# Patient Record
Sex: Male | Born: 1985 | Race: Black or African American | Hispanic: No | Marital: Single | Smoking: Never smoker
Health system: Southern US, Community
[De-identification: ages and names within clinical notes are randomized; demographics above are authoritative.]

## PROBLEM LIST (undated history)

## (undated) HISTORY — PX: NO PAST SURGERIES: SHX2092

---

## 2011-07-22 ENCOUNTER — Emergency Department: Payer: Self-pay | Admitting: *Deleted

## 2013-07-05 ENCOUNTER — Ambulatory Visit: Payer: Self-pay

## 2016-04-06 ENCOUNTER — Ambulatory Visit
Admission: EM | Admit: 2016-04-06 | Discharge: 2016-04-06 | Disposition: A | Discharge status: Home / Self Care | Payer: Self-pay | Attending: Emergency Medicine | Admitting: Emergency Medicine

## 2016-04-06 DIAGNOSIS — R059 Cough, unspecified: Secondary | ICD-10-CM

## 2016-04-06 DIAGNOSIS — K649 Unspecified hemorrhoids: Secondary | ICD-10-CM

## 2016-04-06 DIAGNOSIS — R05 Cough: Secondary | ICD-10-CM

## 2016-04-06 DIAGNOSIS — K219 Gastro-esophageal reflux disease without esophagitis: Secondary | ICD-10-CM

## 2016-04-06 MED ORDER — IBUPROFEN 600 MG PO TABS: 600.0000 mg | ORAL_TABLET | Freq: Four times a day (QID) | ORAL | 0 refills | Status: DC | PRN

## 2016-04-06 MED ORDER — HYDROCORTISONE ACE-PRAMOXINE 2.5-1 % RE CREA: 1.0000 "application " | TOPICAL_CREAM | Freq: Three times a day (TID) | RECTAL | 0 refills | Status: DC

## 2016-04-06 MED ORDER — PANTOPRAZOLE SODIUM 40 MG PO TBEC: 40.0000 mg | DELAYED_RELEASE_TABLET | Freq: Every day | ORAL | 0 refills | Status: AC

## 2016-04-06 MED ORDER — FAMOTIDINE 20 MG PO TABS: 20.0000 mg | ORAL_TABLET | Freq: Two times a day (BID) | ORAL | 0 refills | Status: DC

## 2016-04-06 MED ORDER — GLYCERIN (ADULT) 2 G RE SUPP: 1.0000 | Freq: Once | RECTAL | 0 refills | Status: DC | PRN

## 2016-04-06 MED ORDER — GUAIFENESIN-CODEINE 100-10 MG/5ML PO SYRP: 10.0000 mL | ORAL_SOLUTION | Freq: Four times a day (QID) | ORAL | 0 refills | Status: DC | PRN

## 2016-04-06 NOTE — Discharge Instructions (Signed)
Try the Analpram glycerin suppositories and MiraLAX. You need to keep your stools as soft as possible and the Cheratussin will make you constipated. Shower instead of wiping after having a bowel movement, Sitz baths as we discussed. Try the Pepcid and Protonix and see if this helps your GERD symptoms which I think is causing your cough. 600 milligrams ibuprofen with 1 g of Tylenol 3-4 times a day for chest wall pain. Go to the ER for the signs and symptoms we discussed Follow up with one of the primary care providers below  Here is a list of primary care providers who are taking new patients:  Dr. Elizabeth Sauereanna Jones [address] [place] [id number] [phone]  Dr. Everlene OtherJayce Cook [address]  [address]  [place] [address]  [phone]  [facility] [place] [address] [place] [address]  [place] [address]  [phone]

## 2016-04-06 NOTE — ED Triage Notes (Signed)
Patient complains of cough that causes chest to hurt afterwards, bodyaches, fevers x 3 days. Patient states that he has noticed blood in stool when he wipes that is bright red blood x 4 days.

## 2016-04-06 NOTE — ED Provider Notes (Signed)
HPI  SUBJECTIVE:  Nathan Schmidt is a 31 y.o. male who presents with 2 complaints. First, he reports 3 to 4 days of a nonproductive cough, sharp, chest pain with coughing only located in the middle of his chest, shortness of breath secondary to the cough. No shortness of breath. He reports occasional bodyaches. States that he is unable to sleep at night secondary to the cough. He reports water brash and burning chest pain as well. No aggravating or alleviating factors. He has not tried anything for this. He denies any other chest pain, tightness, heaviness, wheezing, dyspnea on exertion, posttussive emesis. Patient states he feels feverish but has no documented temperatures. No nasal congestion, rhinorrhea, postnasal drip, allergy type symptoms, headache, sore throat. No antibiotics in the past month, antipyretic in the past 6-8 hours. Patient did not get a flu shot. He has no contacts with flu.   Second patient describes 3-4 days of bright red blood per rectum on the toilet paper and in the toilet after having bowel movements. He states that it is painful and that he feels some rectal masses. States that he has had hard stools recently and has been straining to defecate. He denies abdominal, back or pelvic pain. There are no aggravating or alleviating factors. He has not tried anything for this. Past medical history of GERD for which he does not take anything. No history of lung disease, smoking, hypertension, hemorrhoids, GI bleed, NSAID use, abdominal surgeries, antiplatelet or anticoagulant use, diabetes, hypertension, CHF. PMD: None.  History reviewed. No pertinent past medical history.  Past Surgical History:  Procedure Laterality Date  . NO PAST SURGERIES      Family History  Problem Relation Age of Onset  . Breast cancer Mother     Social History  Substance Use Topics  . Smoking status: Never Smoker  . Smokeless tobacco: Never Used  . Alcohol use Yes     Comment: occasionally     No current facility-administered medications for this encounter.   Current Outpatient Prescriptions:  .  famotidine (PEPCID) 20 MG tablet, Take 1 tablet (20 mg total) by mouth 2 (two) times daily., Disp: 40 tablet, Rfl: 0 .  glycerin adult 2 g suppository, Place 1 suppository rectally once as needed (constipation)., Disp: 12 suppository, Rfl: 0 .  guaiFENesin-codeine (CHERATUSSIN AC) 100-10 MG/5ML syrup, Take 10 mLs by mouth 4 (four) times daily as needed for cough., Disp: 120 mL, Rfl: 0 .  hydrocortisone-pramoxine (ANALPRAM HC) 2.5-1 % rectal cream, Place 1 application rectally 3 (three) times daily., Disp: 30 g, Rfl: 0 .  ibuprofen (ADVIL,MOTRIN) 600 MG tablet, Take 1 tablet (600 mg total) by mouth every 6 (six) hours as needed., Disp: 30 tablet, Rfl: 0 .  pantoprazole (PROTONIX) 40 MG tablet, Take 1 tablet (40 mg total) by mouth daily., Disp: 20 tablet, Rfl: 0  No Known Allergies   ROS  As noted in HPI.   Physical Exam  BP 139/88 (BP Location: Left Arm)   Pulse 83   Temp 98.6 F (37 C) (Oral)   Resp 16   Ht 6' (1.829 m)   Wt 215 lb (97.5 kg)   SpO2 100%   BMI 29.16 kg/m   Constitutional: Well developed, well nourished, no acute distress Eyes: PERRL, EOMI, conjunctiva normal bilaterally HENT: Normocephalic, atraumatic,mucus membranes moist. No nasal congestion. No obvious postnasal drip  Respiratory: Clear to auscultation bilaterally, no rales, no wheezing, no rhonchi no chest wall tenderness  Cardiovascular: Normal rate and rhythm, no  murmurs, no gallops, no rubs GI: Soft, nondistended, normal bowel sounds, nontender, no rebound, no guarding Rectal: +2 large hemorrhoids from the 9:00 to 12:00 position in the 1:00 to 3:00 position. The one on the right is tender but nonthrombosed. No active bleeding. No stool in rectal vault. Hemoccult not done. Patient declined chaperone skin: No rash, skin intact Musculoskeletal: No edema, no tenderness, no deformities Neurologic:  Alert & oriented x 3, CN II-XII grossly intact, no motor deficits, sensation grossly intact Psychiatric: Speech and behavior appropriate   ED Course   Medications - No data to display  No orders of the defined types were placed in this encounter.  No results found for this or any previous visit (from the past 24 hour(s)). No results found.  ED Clinical Impression  Gastroesophageal reflux disease, esophagitis presence not specified  Hemorrhoids, unspecified hemorrhoid type  Cough   ED Assessment/Plan  1. Cough: Feel that the cough is most likely from his acid reflux as he denies allergy or URI-type symptoms. He has no history of lung disease, is not a smoker, his lungs are clear, his not on ACE inhibitor and he states that his GERD is bothering him. Plan to send home with Pepcid, Protonix, ibuprofen 600 mg with 1 g of Tylenol 3-4 times a day for the chest wall pain and Tussionex to help him sleep at night. Doubt cardiac cause of his symptoms.  2: Hematochezia. Patient has 2 large external hemorrhoids, one which is painful but does not appear thrombosed. Advised stool softeners such as glycerin suppositories, MiraLAX, sitz baths, anusol, avoid straining while defecating. Doubt GI bleed.  We'll provide a primary care referral for continued care. Discussed signs and symptoms that should prompt return to the emergency department. Patient agrees with plan. Also work note for today.  Meds ordered this encounter  Medications  . famotidine (PEPCID) 20 MG tablet    Sig: Take 1 tablet (20 mg total) by mouth 2 (two) times daily.    Dispense:  40 tablet    Refill:  0  . pantoprazole (PROTONIX) 40 MG tablet    Sig: Take 1 tablet (40 mg total) by mouth daily.    Dispense:  20 tablet    Refill:  0  . ibuprofen (ADVIL,MOTRIN) 600 MG tablet    Sig: Take 1 tablet (600 mg total) by mouth every 6 (six) hours as needed.    Dispense:  30 tablet    Refill:  0  . hydrocortisone-pramoxine  (ANALPRAM HC) 2.5-1 % rectal cream    Sig: Place 1 application rectally 3 (three) times daily.    Dispense:  30 g    Refill:  0  . glycerin adult 2 g suppository    Sig: Place 1 suppository rectally once as needed (constipation).    Dispense:  12 suppository    Refill:  0  . guaiFENesin-codeine (CHERATUSSIN AC) 100-10 MG/5ML syrup    Sig: Take 10 mLs by mouth 4 (four) times daily as needed for cough.    Dispense:  120 mL    Refill:  0    *This clinic note was created using Scientist, clinical (histocompatibility and immunogenetics). Therefore, there may be occasional mistakes despite careful proofreading.  ?   Domenick Gong, MD 04/06/16 1034

## 2016-07-06 ENCOUNTER — Emergency Department
Admission: EM | Admit: 2016-07-06 | Discharge: 2016-07-06 | Disposition: A | Discharge status: Home / Self Care | Payer: Self-pay | Attending: Emergency Medicine | Admitting: Emergency Medicine

## 2016-07-06 ENCOUNTER — Emergency Department: Payer: Self-pay

## 2016-07-06 ENCOUNTER — Encounter: Payer: Self-pay | Admitting: *Deleted

## 2016-07-06 DIAGNOSIS — Y9302 Activity, running: Secondary | ICD-10-CM | POA: Insufficient documentation

## 2016-07-06 DIAGNOSIS — S93601A Unspecified sprain of right foot, initial encounter: Secondary | ICD-10-CM | POA: Insufficient documentation

## 2016-07-06 DIAGNOSIS — Z79899 Other long term (current) drug therapy: Secondary | ICD-10-CM | POA: Insufficient documentation

## 2016-07-06 DIAGNOSIS — X509XXA Other and unspecified overexertion or strenuous movements or postures, initial encounter: Secondary | ICD-10-CM | POA: Insufficient documentation

## 2016-07-06 DIAGNOSIS — Y999 Unspecified external cause status: Secondary | ICD-10-CM | POA: Insufficient documentation

## 2016-07-06 DIAGNOSIS — Y929 Unspecified place or not applicable: Secondary | ICD-10-CM | POA: Insufficient documentation

## 2016-07-06 MED ORDER — TRAMADOL HCL 50 MG PO TABS: 50.0000 mg | ORAL_TABLET | Freq: Two times a day (BID) | ORAL | 0 refills | Status: DC | PRN

## 2016-07-06 MED ORDER — NAPROXEN 500 MG PO TABS: 500.0000 mg | ORAL_TABLET | Freq: Two times a day (BID) | ORAL | 2 refills | Status: AC

## 2016-07-06 NOTE — ED Notes (Signed)
Patient reports rolling right ankle while exercising this morning. States he heard a pop and is now having pain to outside of right foot and ankle. Swelling noted to right ankle. Good pedal pulses.

## 2016-07-06 NOTE — ED Notes (Signed)
The patient was able to walk using the crutches without any issues.

## 2016-07-06 NOTE — ED Provider Notes (Signed)
[facility] Emergency Department Provider Note   ____________________________________________   First MD Initiated Contact with Patient 07/06/16 1039     (approximate)  I have reviewed the triage vital signs and the nursing notes.   HISTORY  Chief Complaint Foot Injury    HPI Nathan Schmidt is a 31 y.o. male patient complaining of right foot pain secondary to a twisting incident while running yesterday. Patient state increased pain and swelling this morning. Patient stated pain increases with weightbearing and ambulation. No palliative measures for complaint.Patient rates pain as a 10 over 10. Patient is describing the pain as "shooting".   History reviewed. No pertinent past medical history.  There are no active problems to display for this patient.   Past Surgical History:  Procedure Laterality Date  . NO PAST SURGERIES      Prior to Admission medications   Medication Sig Start Date End Date Taking? Authorizing Provider  famotidine (PEPCID) 20 MG tablet Take 1 tablet (20 mg total) by mouth 2 (two) times daily. 04/06/16   Domenick GongMortenson, Ashley, MD  glycerin adult 2 g suppository Place 1 suppository rectally once as needed (constipation). 04/06/16   Domenick GongMortenson, Ashley, MD  guaiFENesin-codeine (CHERATUSSIN AC) 100-10 MG/5ML syrup Take 10 mLs by mouth 4 (four) times daily as needed for cough. 04/06/16   Domenick GongMortenson, Ashley, MD  hydrocortisone-pramoxine [facility](ANALPRAM HC) 2.5-1 % rectal cream Place 1 application rectally 3 (three) times daily. 04/06/16   Domenick GongMortenson, Ashley, MD  ibuprofen (ADVIL,MOTRIN) 600 MG tablet Take 1 tablet (600 mg total) by mouth every 6 (six) hours as needed. 04/06/16   Domenick GongMortenson, Ashley, MD  pantoprazole (PROTONIX) 40 MG tablet Take 1 tablet (40 mg total) by mouth daily. 04/06/16   Domenick GongMortenson, Ashley, MD    Allergies Patient has no known allergies.  Family History  Problem Relation Age of Onset  . Breast cancer Mother     Social  History Social History  Substance Use Topics  . Smoking status: Never Smoker  . Smokeless tobacco: Never Used  . Alcohol use Yes     Comment: occasionally    Review of Systems  Constitutional: No fever/chills Eyes: No visual changes. ENT: No sore throat. Cardiovascular: Denies chest pain. Respiratory: Denies shortness of breath. Gastrointestinal: No abdominal pain.  No nausea, no vomiting.  No diarrhea.  No constipation. Genitourinary: Negative for dysuria. Musculoskeletal:Right dorsal foot pain  Skin: Negative for rash. Neurological: Negative for headaches, focal weakness or numbness.   ____________________________________________   PHYSICAL EXAM:  VITAL SIGNS: ED Triage Vitals  Enc Vitals Group     BP 07/06/16 1021 (!) 147/80     Pulse Rate 07/06/16 1021 90     Resp 07/06/16 1021 20     Temp 07/06/16 1021 97.8 F (36.6 C)     Temp src --      SpO2 07/06/16 1021 100 %     Weight 07/06/16 1022 215 lb (97.5 kg)     Height 07/06/16 1022 6' (1.829 m)     Head Circumference --      Peak Flow --      Pain Score 07/06/16 1021 10     Pain Loc --      Pain Edu? --      Excl. in GC? --     Constitutional: Alert and oriented. Well appearing and in no acute distress. Neck: No stridor.  No cervical spine tenderness to palpation. Hematological/Lymphatic/Immunilogical: No cervical lymphadenopathy. Cardiovascular: Normal rate, regular rhythm. Grossly normal  heart sounds.  Good peripheral circulation. Respiratory: Normal respiratory effort.  No retractions. Lungs CTAB. Gastrointestinal: Soft and nontender. No distention. No abdominal bruits. No CVA tenderness. Musculoskeletal: No lower extremity tenderness nor edema.  No joint effusions. Neurologic:  Normal speech and language. No gross focal neurologic deficits are appreciated. No gait instability. Skin:  Skin is warm, dry and intact. No rash noted. Psychiatric: Mood and affect are normal. Speech and behavior are  normal.  ____________________________________________   LABS (all labs ordered are listed, but only abnormal results are displayed)  Labs Reviewed - No data to display ____________________________________________  EKG   ____________________________________________  RADIOLOGY  No results found. No acute findings x-ray of the right foot. ____________________________________________   PROCEDURES  Procedure(s) performed: None  Procedures  Critical Care performed: No  ____________________________________________   INITIAL IMPRESSION / ASSESSMENT AND PLAN / ED COURSE  Pertinent labs & imaging results that were available during my care of the patient were reviewed by me and considered in my medical decision making (see chart for details).  Sprain right foot. Discuss negative x-ray finding with patient. Patient given discharge care instructions. Patient advised follow "clinic if condition persists.      ____________________________________________   FINAL CLINICAL IMPRESSION(S) / ED DIAGNOSES  Final diagnoses:  None      NEW MEDICATIONS STARTED DURING THIS VISIT:  New Prescriptions   No medications on file     Note:  This document was prepared using Dragon voice recognition software and may include unintentional dictation errors.    Joni Reining, PA-C 07/06/16 1116    Schaevitz, Myra Rude, MD 07/06/16 [phone]

## 2016-07-06 NOTE — ED Triage Notes (Signed)
Pt was running yesterday, stepped down wrong injuring right foot, right foot is swollen and painful

## 2016-07-06 NOTE — Discharge Instructions (Signed)
Ambulating with supportive for discharge care instructions

## 2018-10-27 IMAGING — DX DG FOOT COMPLETE 3+V*R*
3 series · 3 of 3 positions shown · non-contrast
Comparison: None.

CLINICAL DATA: 31-year-old male with a history of athletic injury
right foot. Pain on the lateral side.

EXAM:
RIGHT FOOT COMPLETE - 3+ VIEW

[foot ap]
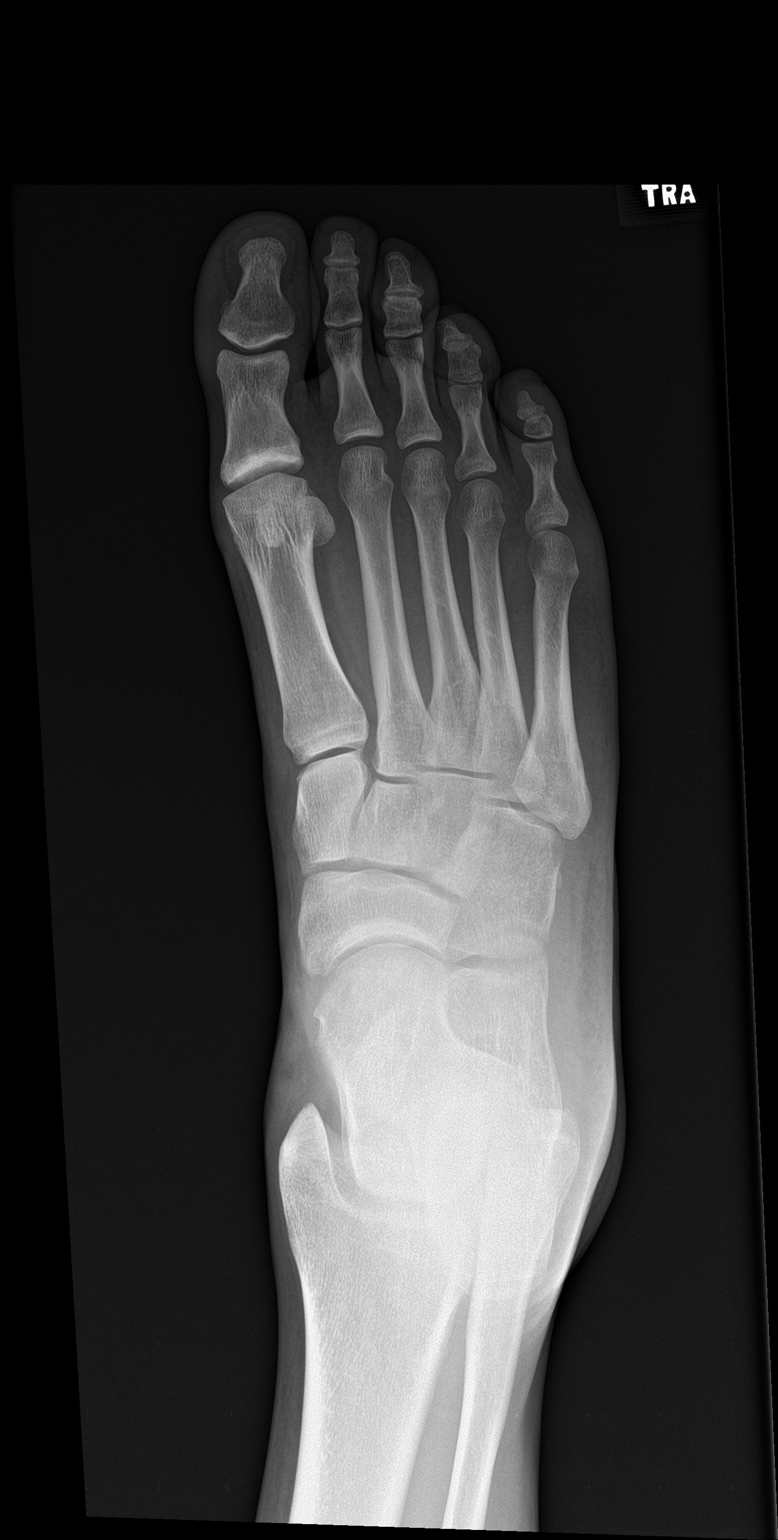

[foot obl]
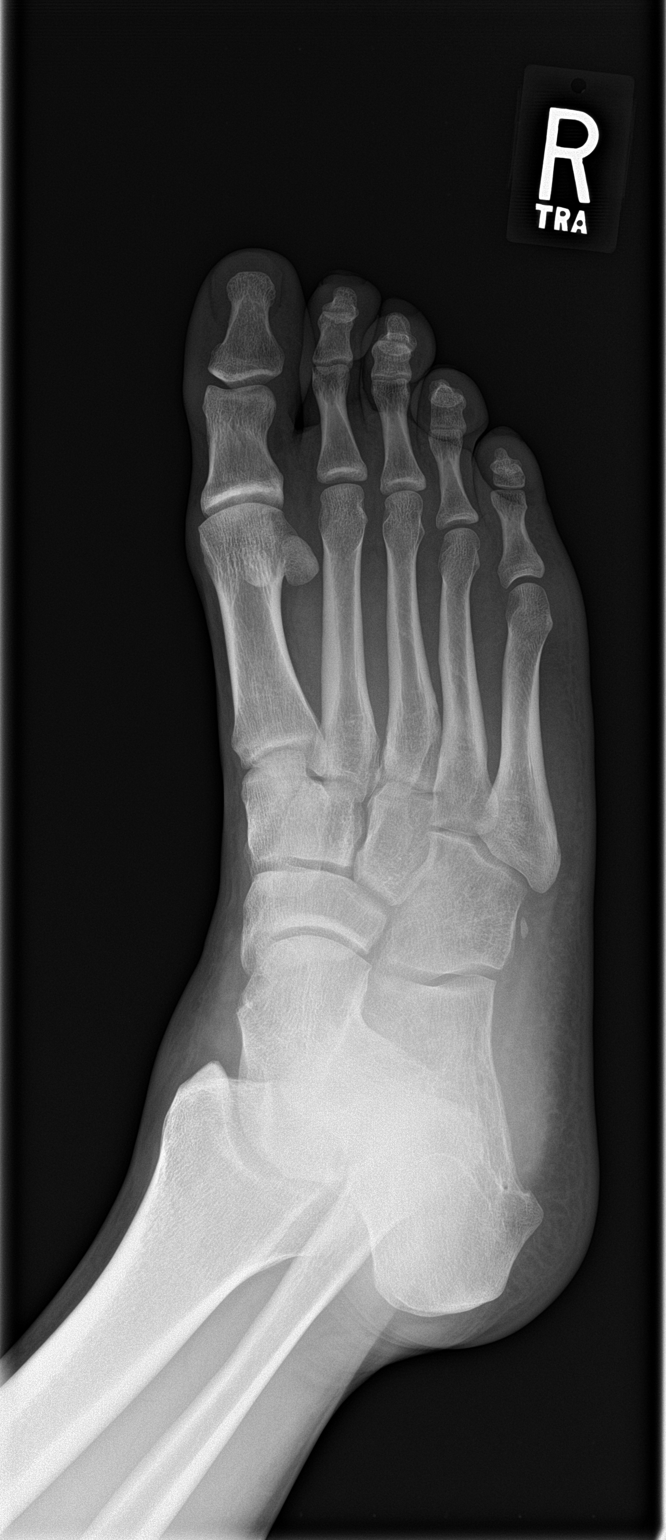

[foot lat]
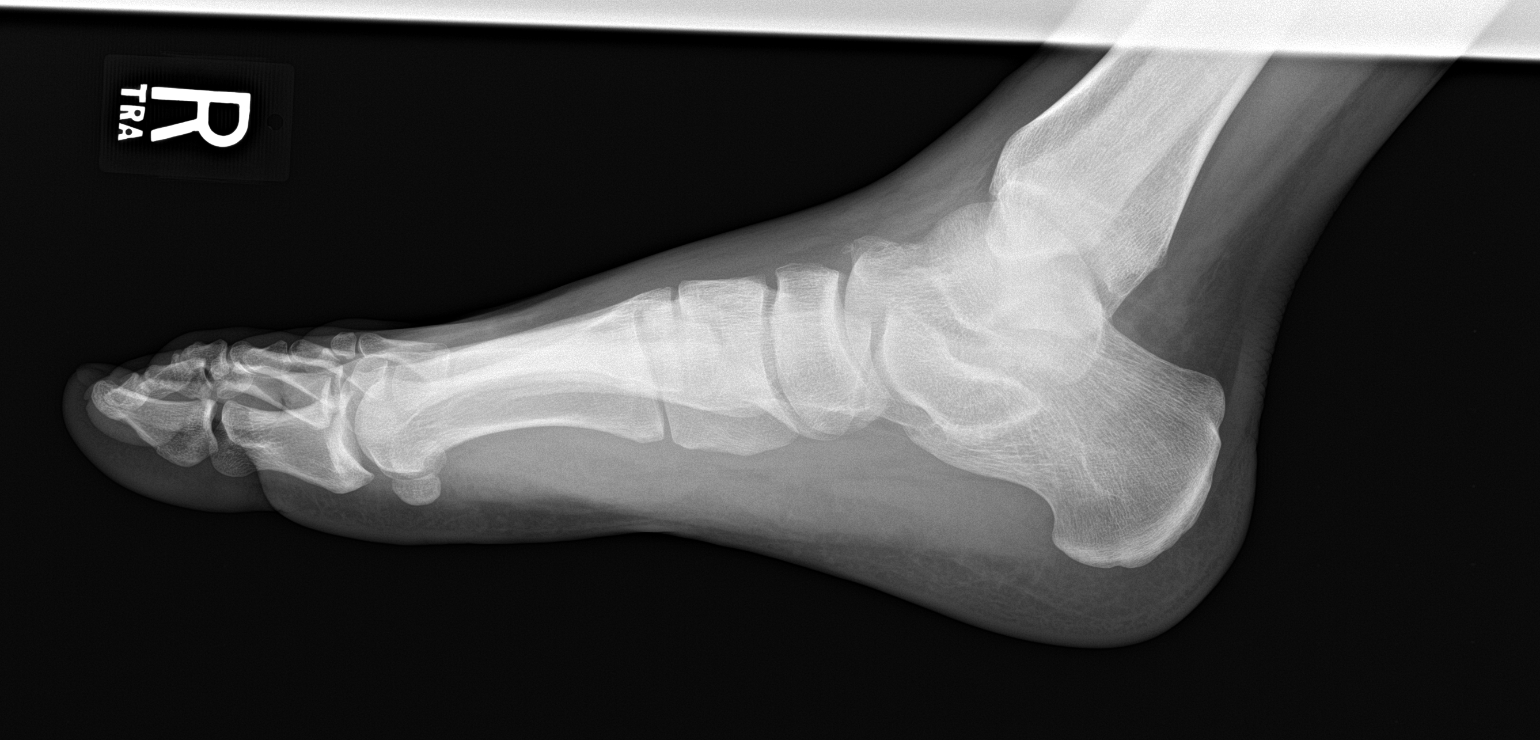

[3 of 3 positions shown; findings below may reference images not displayed]

FINDINGS: No acute displaced fracture. No focal soft tissue swelling. No
radiopaque foreign body. Accessory ossicle versus calcification of
remote injury on the lateral aspect of the foot adjacent to the
cuboid.

Mild degenerative changes of the midfoot.
IMPRESSION: Negative for acute bony abnormality.

## 2020-02-17 ENCOUNTER — Other Ambulatory Visit: Payer: Self-pay

## 2021-04-11 ENCOUNTER — Other Ambulatory Visit: Payer: Self-pay

## 2021-04-11 ENCOUNTER — Ambulatory Visit
Admission: EM | Admit: 2021-04-11 | Discharge: 2021-04-11 | Disposition: A | Discharge status: Home / Self Care | Payer: Self-pay | Attending: Emergency Medicine | Admitting: Emergency Medicine

## 2021-04-11 ENCOUNTER — Encounter: Payer: Self-pay | Admitting: Emergency Medicine

## 2021-04-11 DIAGNOSIS — M546 Pain in thoracic spine: Secondary | ICD-10-CM

## 2021-04-11 MED ORDER — NAPROXEN SODIUM 550 MG PO TABS: 550.0000 mg | ORAL_TABLET | Freq: Two times a day (BID) | ORAL | 0 refills | Status: AC

## 2021-04-11 MED ORDER — CYCLOBENZAPRINE HCL 10 MG PO TABS: 10.0000 mg | ORAL_TABLET | Freq: Two times a day (BID) | ORAL | 0 refills | Status: AC | PRN

## 2021-04-11 NOTE — Discharge Instructions (Signed)
Your pain is most likely caused by irritation to the muscles or ligaments.  ? ?Take naproxen twice daily for the next 7 days, this medication is to help reduce inflammation and stop the inflammatory process which in turn will help with your pain ? ?You may use muscle relaxer twice daily for additional comfort, be mindful this medication may make you drowsy, it occurs you may use at bedtime ? ?You may use heating pad in 15 minute intervals as needed for additional comfort, within the first 2-3 days you may find comfort in using ice in 10-15 minutes over affected area ? ?Begin stretching affected area daily for 10 minutes as tolerated to further loosen muscles  ? ?When lying down place pillow underneath and between knees for support ? ?Can try sleeping without pillow on firm mattress  ? ?Practice good posture: head back, shoulders back, chest forward, pelvis back and weight distributed evenly on both legs ? ?If pain persist after recommended treatment or reoccurs if may be beneficial to follow up with orthopedic specialist for evaluation, this doctor specializes in the bones and can manage your symptoms long-term with options such as but not limited to imaging, medications or physical therapy  ?  ?

## 2021-04-11 NOTE — ED Provider Notes (Signed)
?[facility] ? ? ? ?CSN: [id number] ?Arrival date & time: 04/11/21  1214 ? ? ?  ? ?History   ?Chief Complaint ?Chief Complaint  ?Patient presents with  ? Back Pain  ? ? ?HPI ?Nathan Schmidt is a 36 y.o. male.  ? ?Patient presents with upper back pain, left worse than right occurring for 1 day.  Endorses that he slipped and caught his self, symptoms started shortly after.  Worsened by movement specifically twisting and turning.  Pain does not radiate.  Denies numbness, tingling, prior injury or trauma, urinary or bowel incontinence.  Has attempted use of Tylenol which was ineffective.  ? ?History reviewed. No pertinent past medical history. ? ?There are no problems to display for this patient. ? ? ?Past Surgical History:  ?Procedure Laterality Date  ? NO PAST SURGERIES    ? ? ? ? ? ?Home Medications   ? ?Prior to Admission medications   ?Medication Sig Start Date End Date Taking? Authorizing Provider  ?famotidine (PEPCID) 20 MG tablet Take 1 tablet (20 mg total) by mouth 2 (two) times daily. 04/06/16   Domenick Gong, MD  ?glycerin adult 2 g suppository Place 1 suppository rectally once as needed (constipation). 04/06/16   Domenick Gong, MD  ?guaiFENesin-codeine [facility]) 100-10 MG/5ML syrup Take 10 mLs by mouth 4 (four) times daily as needed for cough. 04/06/16   Domenick Gong, MD  ?hydrocortisone-pramoxine [facility]) 2.5-1 % rectal cream Place 1 application rectally 3 (three) times daily. 04/06/16   Domenick Gong, MD  ?ibuprofen (ADVIL,MOTRIN) 600 MG tablet Take 1 tablet (600 mg total) by mouth every 6 (six) hours as needed. 04/06/16   Domenick Gong, MD  ?pantoprazole (PROTONIX) 40 MG tablet Take 1 tablet (40 mg total) by mouth daily. 04/06/16   Domenick Gong, MD  ?traMADol (ULTRAM) 50 MG tablet Take 1 tablet (50 mg total) by mouth every 12 (twelve) hours as needed. 07/06/16   Joni Reining, PA-C  ? ? ?Family History ?Family History  ?Problem Relation Age of Onset  ?  Breast cancer Mother   ? ? ?Social History ?Social History  ? ?Tobacco Use  ? Smoking status: Never  ? Smokeless tobacco: Never  ?Vaping Use  ? Vaping Use: Never used  ?Substance Use Topics  ? Alcohol use: Yes  ?  Comment: occasionally  ? Drug use: No  ? ? ? ?Allergies   ?Patient has no known allergies. ? ? ?Review of Systems ?Review of Systems  ?Constitutional: Negative.   ?Respiratory: Negative.    ?Cardiovascular: Negative.   ?Musculoskeletal:  Positive for back pain. Negative for arthralgias, gait problem, joint swelling, myalgias, neck pain and neck stiffness.  ?Skin: Negative.   ?Neurological: Negative.   ? ? ?Physical Exam ?Triage Vital Signs ?ED Triage Vitals  ?Enc Vitals Group  ?   BP 04/11/21 1235 (!) 150/93  ?   Pulse Rate 04/11/21 1235 71  ?   Resp 04/11/21 1235 16  ?   Temp 04/11/21 1235 98.8 ?F (37.1 ?C)  ?   Temp Source 04/11/21 1235 Oral  ?   SpO2 04/11/21 1235 97 %  ?   Weight 04/11/21 1233 214 lb 15.2 oz (97.5 kg)  ?   Height 04/11/21 1233 6' (1.829 m)  ?   Head Circumference --   ?   Peak Flow --   ?   Pain Score 04/11/21 1232 10  ?   Pain Loc --   ?   Pain Edu? --   ?  Excl. in GC? --   ? ?No data found. ? ?Updated Vital Signs ?BP (!) 150/93 (BP Location: Left Arm)   Pulse 71   Temp 98.8 ?F (37.1 ?C) (Oral)   Resp 16   Ht 6' (1.829 m)   Wt 214 lb 15.2 oz (97.5 kg)   SpO2 97%   BMI 29.15 kg/m?  ? ?Visual Acuity ?Right Eye Distance:   ?Left Eye Distance:   ?Bilateral Distance:   ? ?Right Eye Near:   ?Left Eye Near:    ?Bilateral Near:    ? ?Physical Exam ?Constitutional:   ?   Appearance: Normal appearance.  ?HENT:  ?   Head: Normocephalic.  ?Eyes:  ?   Extraocular Movements: Extraocular movements intact.  ?Pulmonary:  ?   Effort: Pulmonary effort is normal.  ?Musculoskeletal:  ?   Cervical back: Normal.  ?   Lumbar back: Normal.  ?   Comments: Tenderness over the mid to right side thoracic region, without point tenderness noted, no spinal tenderness, range of motion intact  ?Skin: ?    General: Skin is warm and dry.  ?Neurological:  ?   Mental Status: He is alert and oriented to person, place, and time. Mental status is at baseline.  ?Psychiatric:     ?   Mood and Affect: Mood normal.     ?   Behavior: Behavior normal.  ? ? ? ?UC Treatments / Results  ?Labs ?(all labs ordered are listed, but only abnormal results are displayed) ?Labs Reviewed - No data to display ? ?EKG ? ? ?Radiology ?No results found. ? ?Procedures ?Procedures (including critical care time) ? ?Medications Ordered in UC ?Medications - No data to display ? ?Initial Impression / Assessment and Plan / UC Course  ?I have reviewed the triage vital signs and the nursing notes. ? ?Pertinent labs & imaging results that were available during my care of the patient were reviewed by me and considered in my medical decision making (see chart for details). ? ?Acute midline thoracic back pain ?Acute right-sided thoracic back pain ? 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 ?Final diagnoses:  ?None  ? ?Discharge Instructions   ?None ?  ? ?ED Prescriptions   ?None ?  ? ?PDMP not reviewed this encounter. ?  ?Valinda Hoar, NP ?04/11/21 1302 ? ?

## 2021-04-11 NOTE — ED Triage Notes (Signed)
Pt c/o upper back pain. He states yesterday he almost slipped and caught himself. He states the pain is worse with movement and coughing.  ?

## 2022-07-22 ENCOUNTER — Ambulatory Visit
Admission: EM | Admit: 2022-07-22 | Discharge: 2022-07-22 | Disposition: A | Discharge status: Home / Self Care | Payer: Self-pay | Attending: Emergency Medicine | Admitting: Emergency Medicine

## 2022-07-22 ENCOUNTER — Encounter: Payer: Self-pay | Admitting: Emergency Medicine

## 2022-07-22 DIAGNOSIS — H109 Unspecified conjunctivitis: Secondary | ICD-10-CM

## 2022-07-22 MED ORDER — MOXIFLOXACIN HCL 0.5 % OP SOLN: 1.0000 [drp] | Freq: Three times a day (TID) | OPHTHALMIC | 0 refills | Status: AC

## 2022-07-22 NOTE — ED Provider Notes (Signed)
[facility]    CSN: [id number] Arrival date & time: 07/22/22  4098      History   Chief Complaint Chief Complaint  Patient presents with   Eye Irritation     HPI Nathan Schmidt is a 37 y.o. male.   Patient presents for evaluation of right eye erythema, drainage with crusting, increased tearing, itching and pain present for 2 days.  No known sick contact prior.  Has not attempted treatment.  Denies use of contacts or glasses.  Denies injury or trauma to the eye.    History reviewed. No pertinent past medical history.  There are no problems to display for this patient.   Past Surgical History:  Procedure Laterality Date   NO PAST SURGERIES         Home Medications    Prior to Admission medications   Medication Sig Start Date End Date Taking? Authorizing Provider  cyclobenzaprine (FLEXERIL) 10 MG tablet Take 1 tablet (10 mg total) by mouth 2 (two) times daily as needed for muscle spasms. 04/11/21   Valinda Hoar, NP  naproxen sodium (ANAPROX DS) 550 MG tablet Take 1 tablet (550 mg total) by mouth 2 (two) times daily with a meal. 04/11/21   Dainel Arcidiacono, Elita Boone, NP  pantoprazole (PROTONIX) 40 MG tablet Take 1 tablet (40 mg total) by mouth daily. 04/06/16   Domenick Gong, MD    Family History Family History  Problem Relation Age of Onset   Breast cancer Mother     Social History Social History   Tobacco Use   Smoking status: Never   Smokeless tobacco: Never  Vaping Use   Vaping Use: Never used  Substance Use Topics   Alcohol use: Yes    Comment: occasionally   Drug use: No     Allergies   Patient has no known allergies.   Review of Systems Review of Systems  Constitutional: Negative.   HENT: Negative.    Eyes:  Positive for pain, discharge, redness and itching. Negative for photophobia and visual disturbance.  Respiratory: Negative.    Cardiovascular: Negative.   Skin: Negative.      Physical Exam Triage Vital Signs ED  Triage Vitals  Enc Vitals Group     BP 07/22/22 0958 (!) 126/91     Pulse Rate 07/22/22 0958 67     Resp 07/22/22 0958 16     Temp 07/22/22 0958 98.5 F (36.9 C)     Temp Source 07/22/22 0958 Oral     SpO2 07/22/22 0958 100 %     Weight --      Height --      Head Circumference --      Peak Flow --      Pain Score 07/22/22 0957 7     Pain Loc --      Pain Edu? --      Excl. in GC? --    No data found.  Updated Vital Signs BP (!) 126/91 (BP Location: Left Arm)   Pulse 67   Temp 98.5 F (36.9 C) (Oral)   Resp 16   SpO2 100%   Visual Acuity Right Eye Distance:   Left Eye Distance:   Bilateral Distance:    Right Eye Near:   Left Eye Near:    Bilateral Near:     Physical Exam Constitutional:      Appearance: Normal appearance.  Eyes:     Comments: Erythema present to the right conjunctiva, no drainage noted  on exam, vision is grossly intact, extraocular movements intact  Pulmonary:     Effort: Pulmonary effort is normal.  Neurological:     Mental Status: He is alert and oriented to person, place, and time. Mental status is at baseline.      UC Treatments / Results  Labs (all labs ordered are listed, but only abnormal results are displayed) Labs Reviewed - No data to display  EKG   Radiology No results found.  Procedures Procedures (including critical care time)  Medications Ordered in UC Medications - No data to display  Initial Impression / Assessment and Plan / UC Course  I have reviewed the triage vital signs and the nursing notes.  Pertinent labs & imaging results that were available during my care of the patient were reviewed by me and considered in my medical decision making (see chart for details).  Bacterial conjunctivitis of the right eye  Presentation and symptomology consistent with infection, discussed with patient, moxifloxacin prescribed and discussed administration, advised against eye touching or rubbing, recommended oral  antihistamines and cool to warm compresses for comfort, advised to follow-up if symptoms persist or worsen Final Clinical Impressions(s) / UC Diagnoses   Final diagnoses:  None   Discharge Instructions   None    ED Prescriptions   None    PDMP not reviewed this encounter.   Valinda Hoar, NP 07/22/22 1059

## 2022-07-22 NOTE — ED Triage Notes (Signed)
Pt presents with right eye redness and drainage x 2 days

## 2022-07-22 NOTE — Discharge Instructions (Signed)
Today you being treated for bacterial conjunctivitis.   Place one drop of moxifloxacin into the effected eye every 8 hours while awake for 7 days. If the other eye starts to have symptoms you may use medication in it as well. Do not allow tip of dropper to touch eye.  May use cool compress for comfort and to remove discharge if present. Pat the eye, do not wipe.  If wearing contacts, dispose of current pair. Wear glasses until symptoms have resolved.   Do not rub eyes, this may cause more irritation.  May use benadryl as needed to help if itching present.  If symptoms persist after use of medication, please follow up at Urgent Care or with ophthalmologist (eye doctor)
